# Patient Record
Sex: Female | Born: 1980 | Race: White | Hispanic: No | Marital: Single | Smoking: Never smoker
Health system: Southern US, Community
[De-identification: ages and names within clinical notes are randomized; demographics above are authoritative.]

## PROBLEM LIST (undated history)

## (undated) DIAGNOSIS — R9389 Abnormal findings on diagnostic imaging of other specified body structures: Secondary | ICD-10-CM

## (undated) DIAGNOSIS — M542 Cervicalgia: Secondary | ICD-10-CM

## (undated) DIAGNOSIS — R29898 Other symptoms and signs involving the musculoskeletal system: Secondary | ICD-10-CM

## (undated) DIAGNOSIS — H53489 Generalized contraction of visual field, unspecified eye: Secondary | ICD-10-CM

## (undated) DIAGNOSIS — R200 Anesthesia of skin: Secondary | ICD-10-CM

## (undated) DIAGNOSIS — M5412 Radiculopathy, cervical region: Secondary | ICD-10-CM

## (undated) DIAGNOSIS — G43909 Migraine, unspecified, not intractable, without status migrainosus: Secondary | ICD-10-CM

## (undated) DIAGNOSIS — H538 Other visual disturbances: Secondary | ICD-10-CM

## (undated) HISTORY — DX: Abnormal findings on diagnostic imaging of other specified body structures: R93.89

## (undated) HISTORY — DX: Other visual disturbances: H53.8

## (undated) HISTORY — DX: Cervicalgia: M54.2

## (undated) HISTORY — DX: Anesthesia of skin: R20.0

## (undated) HISTORY — DX: Radiculopathy, cervical region: M54.12

## (undated) HISTORY — DX: Migraine, unspecified, not intractable, without status migrainosus: G43.909

## (undated) HISTORY — DX: Other symptoms and signs involving the musculoskeletal system: R29.898

## (undated) HISTORY — DX: Generalized contraction of visual field, unspecified eye: H53.489

---
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
## 1999-02-02 ENCOUNTER — Inpatient Hospital Stay (HOSPITAL_COMMUNITY): Admission: AD | Admit: 1999-02-02 | Discharge: 1999-02-02 | Payer: Self-pay | Admitting: *Deleted

## 1999-02-02 ENCOUNTER — Inpatient Hospital Stay (HOSPITAL_COMMUNITY): Admission: AD | Admit: 1999-02-02 | Discharge: 1999-02-02 | Payer: Self-pay | Admitting: Obstetrics and Gynecology

## 1999-02-10 ENCOUNTER — Inpatient Hospital Stay (HOSPITAL_COMMUNITY): Admission: AD | Admit: 1999-02-10 | Discharge: 1999-02-10 | Payer: Self-pay | Admitting: *Deleted

## 1999-03-05 ENCOUNTER — Inpatient Hospital Stay (HOSPITAL_COMMUNITY): Admission: AD | Admit: 1999-03-05 | Discharge: 1999-03-08 | Payer: Self-pay | Admitting: *Deleted

## 1999-03-31 ENCOUNTER — Inpatient Hospital Stay (HOSPITAL_COMMUNITY): Admission: AD | Admit: 1999-03-31 | Discharge: 1999-03-31 | Payer: Self-pay | Admitting: Obstetrics and Gynecology

## 1999-12-24 ENCOUNTER — Encounter: Admission: RE | Admit: 1999-12-24 | Discharge: 1999-12-24 | Payer: Self-pay | Admitting: Internal Medicine

## 1999-12-24 ENCOUNTER — Encounter: Payer: Self-pay | Admitting: Internal Medicine

## 2000-05-13 ENCOUNTER — Other Ambulatory Visit: Admission: RE | Admit: 2000-05-13 | Discharge: 2000-05-13 | Payer: Self-pay | Admitting: Plastic Surgery

## 2001-05-12 ENCOUNTER — Other Ambulatory Visit: Admission: RE | Admit: 2001-05-12 | Discharge: 2001-05-12 | Payer: Self-pay | Admitting: Obstetrics and Gynecology

## 2001-10-18 ENCOUNTER — Encounter: Admission: RE | Admit: 2001-10-18 | Discharge: 2001-11-01 | Payer: Self-pay | Admitting: Obstetrics and Gynecology

## 2001-11-28 ENCOUNTER — Inpatient Hospital Stay (HOSPITAL_COMMUNITY): Admission: AD | Admit: 2001-11-28 | Discharge: 2001-12-01 | Payer: Self-pay | Admitting: *Deleted

## 2001-12-02 ENCOUNTER — Encounter: Admission: RE | Admit: 2001-12-02 | Discharge: 2002-01-01 | Payer: Self-pay | Admitting: Obstetrics and Gynecology

## 2001-12-08 ENCOUNTER — Inpatient Hospital Stay (HOSPITAL_COMMUNITY): Admission: AD | Admit: 2001-12-08 | Discharge: 2001-12-08 | Payer: Self-pay | Admitting: Obstetrics and Gynecology

## 2001-12-10 ENCOUNTER — Inpatient Hospital Stay (HOSPITAL_COMMUNITY): Admission: AD | Admit: 2001-12-10 | Discharge: 2001-12-10 | Payer: Self-pay | Admitting: Obstetrics and Gynecology

## 2002-12-20 ENCOUNTER — Other Ambulatory Visit: Admission: RE | Admit: 2002-12-20 | Discharge: 2002-12-20 | Payer: Self-pay | Admitting: Obstetrics and Gynecology

## 2003-03-08 ENCOUNTER — Encounter: Payer: Self-pay | Admitting: *Deleted

## 2003-03-08 ENCOUNTER — Emergency Department (HOSPITAL_COMMUNITY): Admission: EM | Admit: 2003-03-08 | Discharge: 2003-03-08 | Payer: Self-pay | Admitting: Emergency Medicine

## 2003-12-04 ENCOUNTER — Inpatient Hospital Stay (HOSPITAL_COMMUNITY): Admission: AD | Admit: 2003-12-04 | Discharge: 2003-12-04 | Payer: Self-pay | Admitting: Obstetrics and Gynecology

## 2003-12-25 ENCOUNTER — Encounter: Admission: RE | Admit: 2003-12-25 | Discharge: 2003-12-25 | Payer: Self-pay | Admitting: Obstetrics and Gynecology

## 2004-01-09 ENCOUNTER — Inpatient Hospital Stay (HOSPITAL_COMMUNITY): Admission: AD | Admit: 2004-01-09 | Discharge: 2004-01-09 | Payer: Self-pay | Admitting: Obstetrics and Gynecology

## 2004-01-10 ENCOUNTER — Inpatient Hospital Stay (HOSPITAL_COMMUNITY): Admission: AD | Admit: 2004-01-10 | Discharge: 2004-01-10 | Payer: Self-pay | Admitting: Obstetrics and Gynecology

## 2004-01-23 ENCOUNTER — Ambulatory Visit (HOSPITAL_COMMUNITY): Admission: RE | Admit: 2004-01-23 | Discharge: 2004-01-23 | Payer: Self-pay | Admitting: Obstetrics and Gynecology

## 2004-01-24 ENCOUNTER — Inpatient Hospital Stay (HOSPITAL_COMMUNITY): Admission: AD | Admit: 2004-01-24 | Discharge: 2004-01-26 | Payer: Self-pay | Admitting: Obstetrics and Gynecology

## 2004-03-11 ENCOUNTER — Other Ambulatory Visit: Admission: RE | Admit: 2004-03-11 | Discharge: 2004-03-11 | Payer: Self-pay | Admitting: Obstetrics and Gynecology

## 2004-11-22 IMAGING — US US OB FOLLOW-UP
1 series · 13 of 28 positions shown · non-contrast
Comparison: none

CLINICAL DATA: 38 week 5 day assigned gestational age.  Gestational diabetes.  Measuring large for dates.

[Series 1: unknown · 0.33mm/px · 13 of 30 slices shown]
[im 2/30]
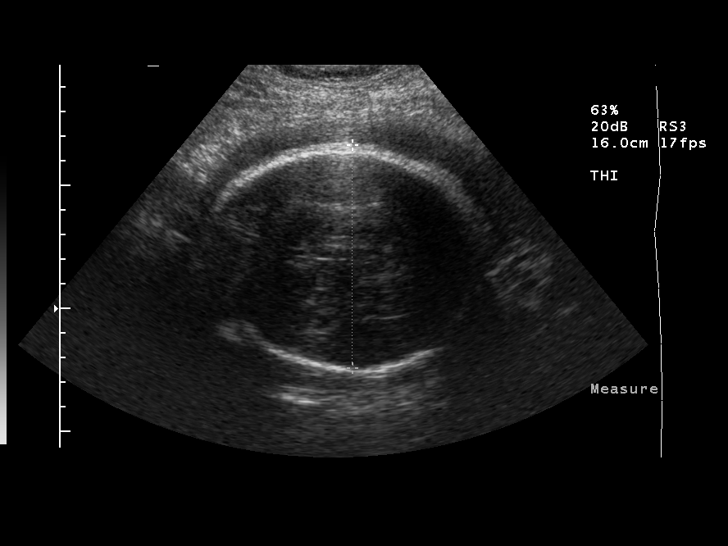
[im 4/30]
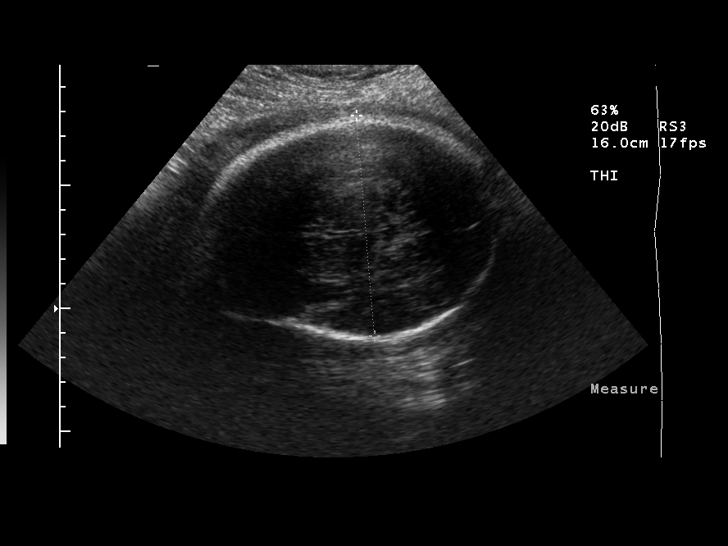
[im 6/30]
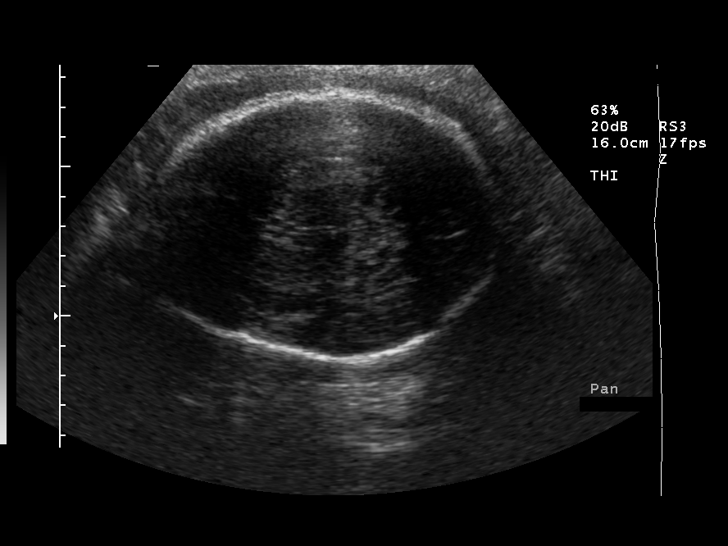
[im 8/30]
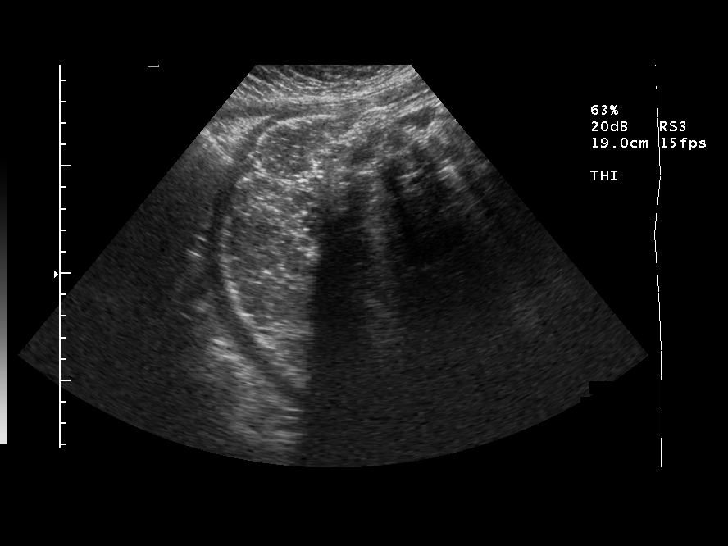
[im 10/30]
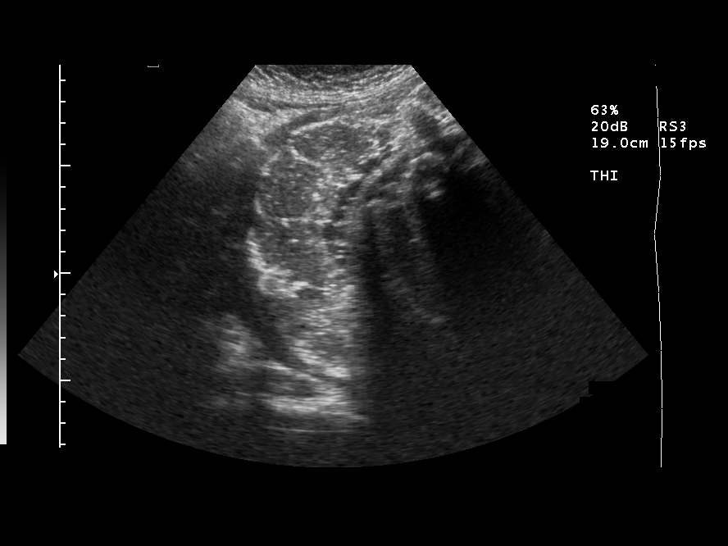
[im 12/30]
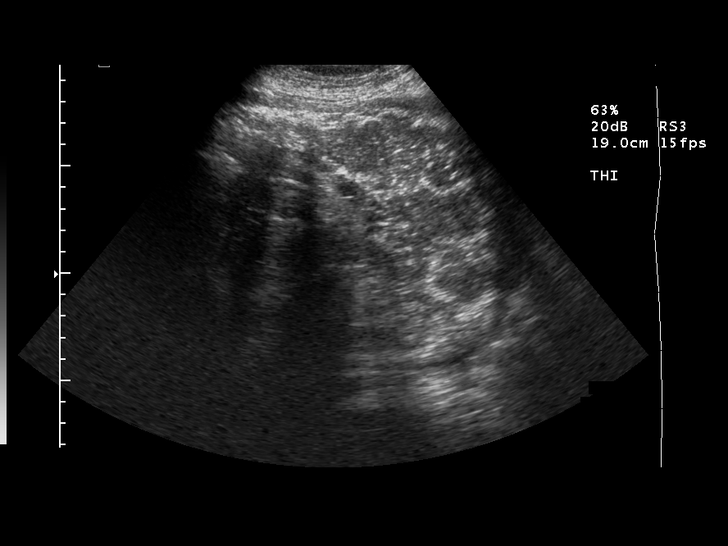
[im 16/30]
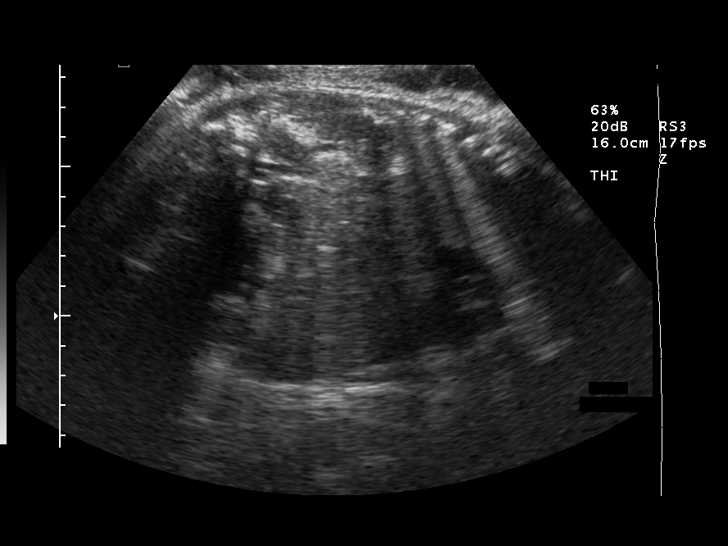
[im 18/30]
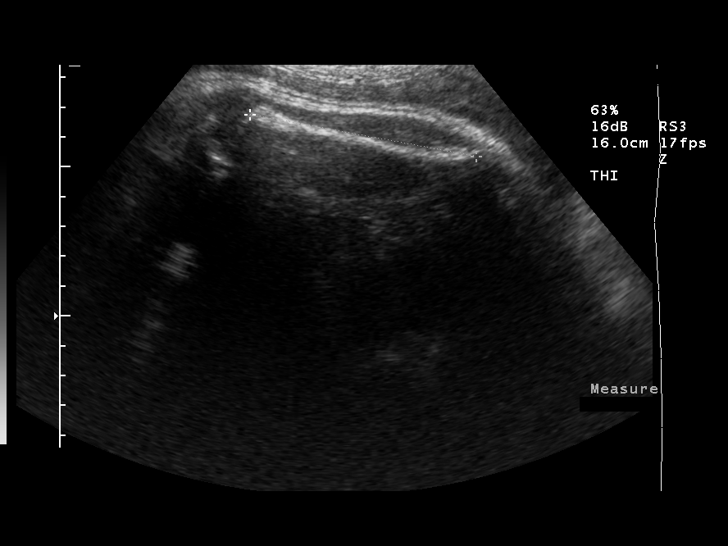
[im 20/30]
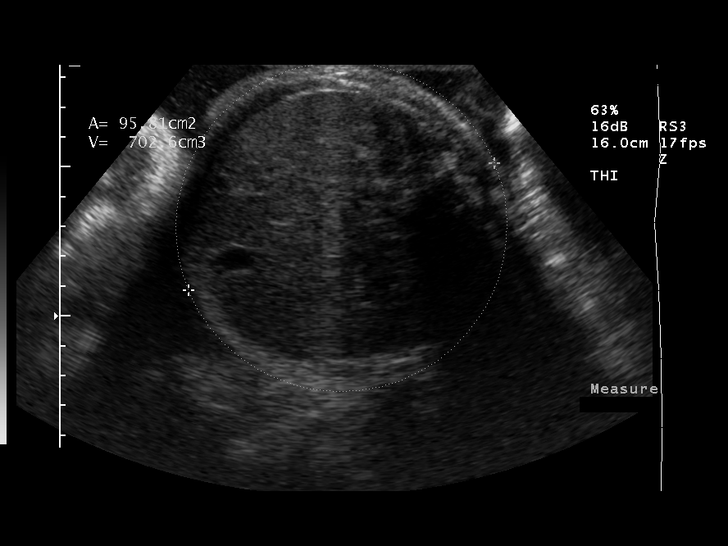
[im 22/30]
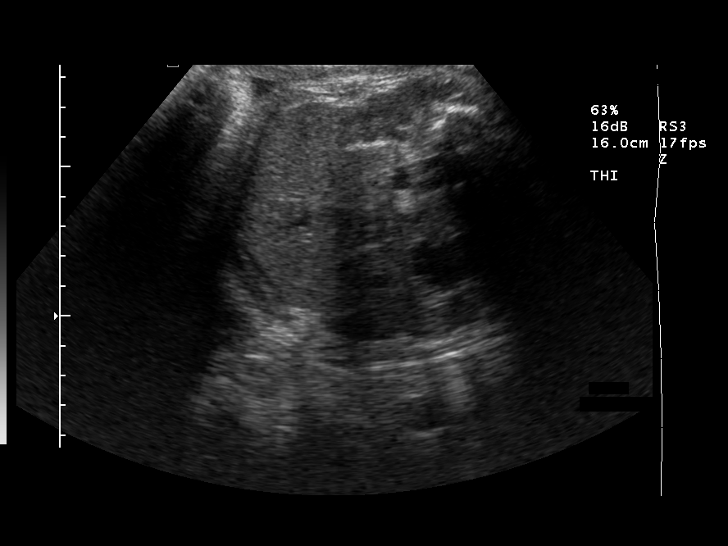
[im 24/30]
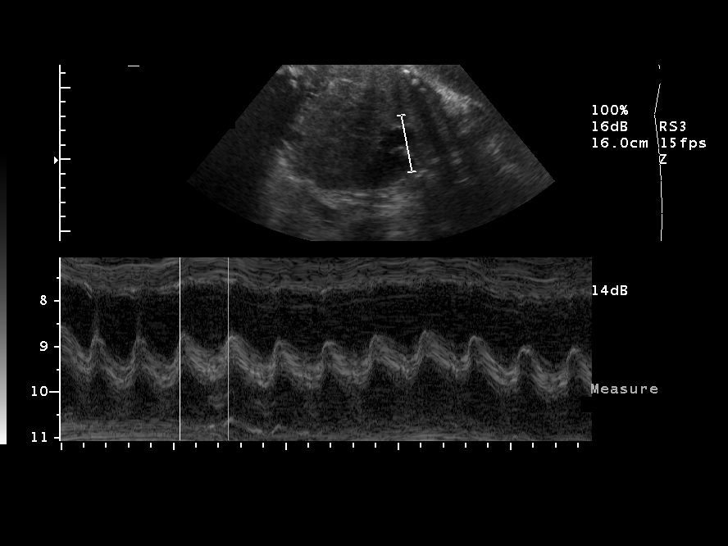
[im 26/30]
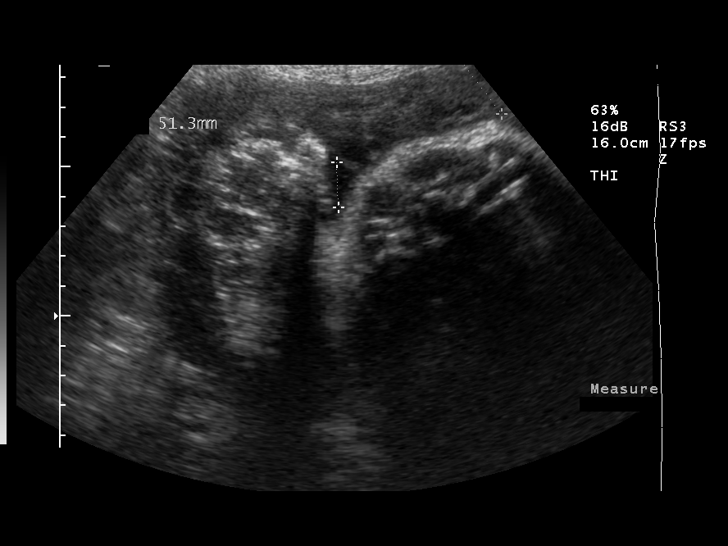
[im 28/30]
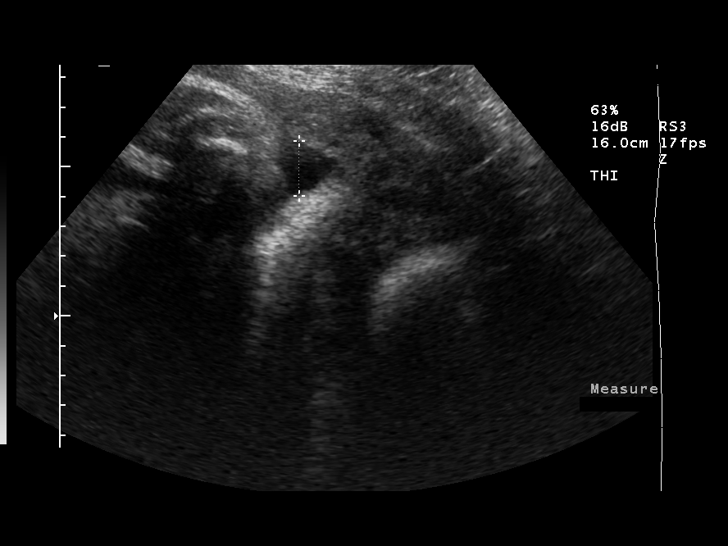

[13 of 28 positions shown; findings below may reference images not displayed]

OBSTETRICAL ULTRASOUND RE-EVALUATION
 Number of Fetuses: 1
 Heart Rate:  140
 Movement:  Yes
 Breathing:  No
 Presentation:  Cephalic
 Placental Location:  Fundal, anterior
 Grade:  II
 Previa:  No
 Amniotic Fluid (subjective):  Normal
 Amniotic Fluid (objective):  9.4 cm AFI (5th -95th%ile = 7.2 – 22.6 cm for 39 wks)

 FETAL BIOMETRY
 BPD:  9.0 cm   36 w 2 d
 HC:  33.6 cm   38 w 3 d
 AC:  34.5 cm   38 w 2 d
 FL:   7.6 cm   38 w 6 d

 Mean GA:  38 w 0 d
 Assigned GA:  38 w 5 d

 EFW: 7589 g (H) 50th – 75th%ile (8118 – 8044 g) For 39 wks 

 FETAL ANATOMY
 Lateral Ventricles:  Visualized 
 Thalami/CSP:  Visualized 
 Posterior Fossa:  Previously seen 
 Nuchal Region:  N/A
 Spine:  Not visualized 
 4 Chamber Heart on Left:  Previously seen 
 Stomach on Left:  Visualized 
 3 Vessel Cord:  Not visualized 
 Cord Insertion Site:  Not visualized 
 Kidneys:  Visualized 
 Bladder:  Visualized 
 Extremities:  Not visualized 
 Limited by:  Advanced gestational age 

 MATERNAL FINDINGS
 Cervix:  Not evaluated
IMPRESSION: Assigned gestational age is currently 38 weeks 5 days.  Appropriate fetal growth, with EFW at the 50th – 75th percentile.
 Normal amniotic fluid volume.

## 2006-10-04 ENCOUNTER — Ambulatory Visit: Payer: Self-pay | Admitting: Family Medicine

## 2018-01-25 ENCOUNTER — Other Ambulatory Visit: Payer: Self-pay | Admitting: Internal Medicine

## 2018-01-25 DIAGNOSIS — R1031 Right lower quadrant pain: Principal | ICD-10-CM

## 2018-01-25 DIAGNOSIS — G8929 Other chronic pain: Secondary | ICD-10-CM

## 2018-02-02 ENCOUNTER — Ambulatory Visit
Admission: RE | Admit: 2018-02-02 | Discharge: 2018-02-02 | Disposition: A | Discharge status: Home / Self Care | Payer: No Typology Code available for payment source | Source: Ambulatory Visit | Attending: Internal Medicine | Admitting: Internal Medicine

## 2018-02-02 DIAGNOSIS — R1031 Right lower quadrant pain: Principal | ICD-10-CM

## 2018-02-02 DIAGNOSIS — G8929 Other chronic pain: Secondary | ICD-10-CM

## 2018-07-30 IMAGING — US US PELVIS COMPLETE TRANSABD/TRANSVAG
1 series · 13 of 25 positions shown · non-contrast
Comparison: None

CLINICAL DATA: Initial evaluation for chronic right lower quadrant
pain.



[Series 1: us pelvis complete transabd/transvag · 0.30mm/px · 13 of 97 slices shown]
[im 1/97]
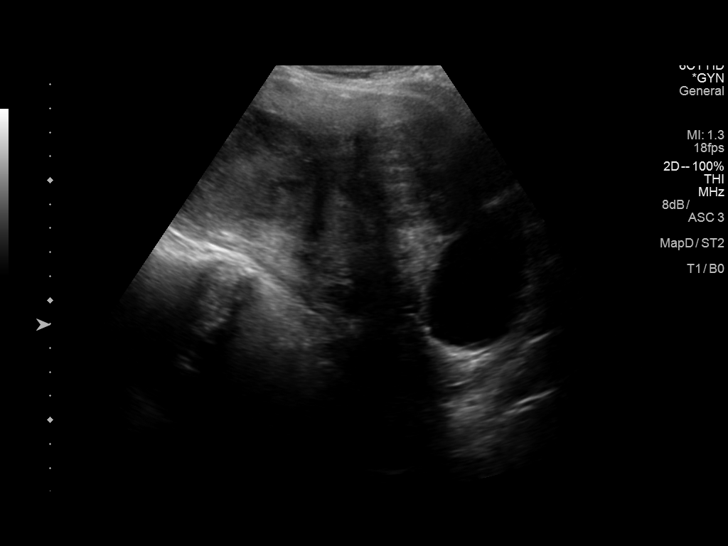
[im 9/97]
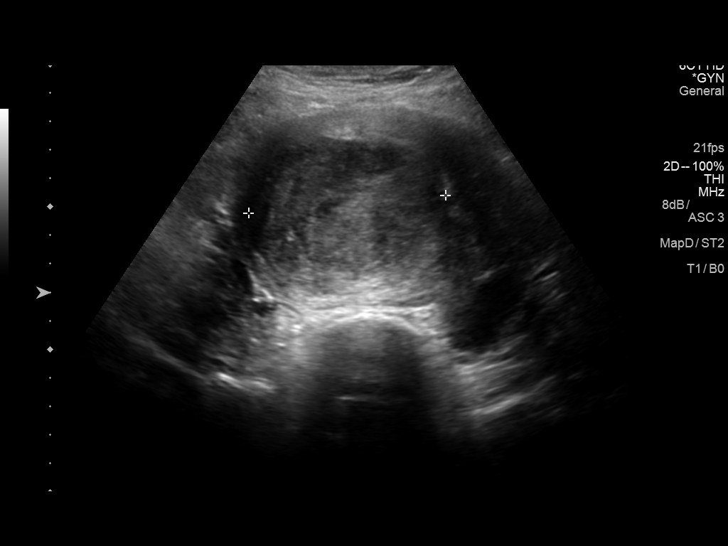
[im 17/97]
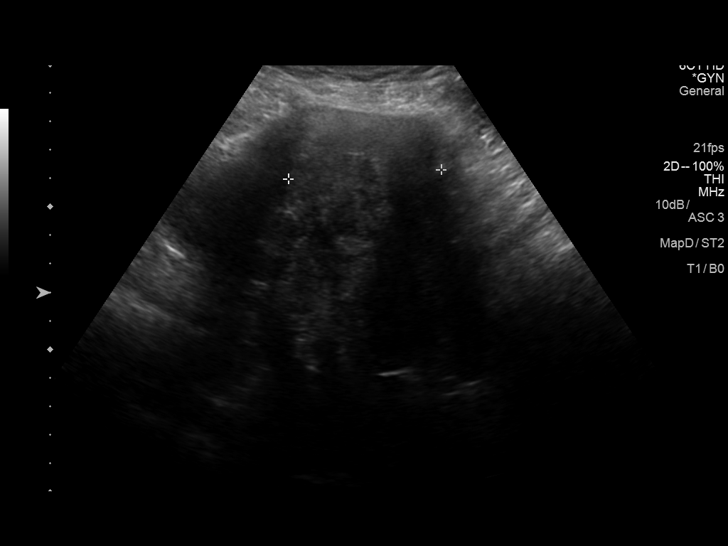
[im 25/97]
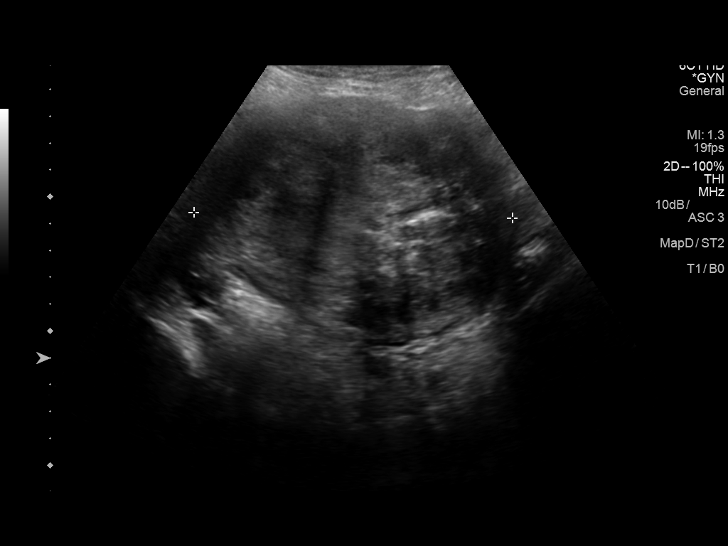
[im 33/97]
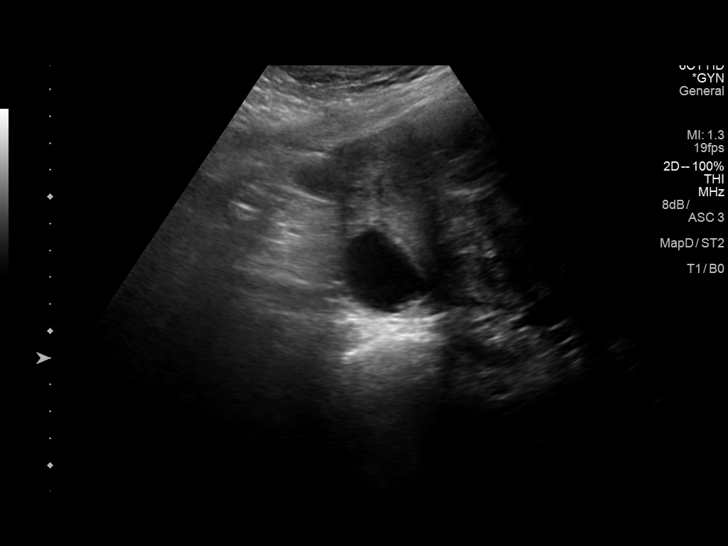
[im 41/97]
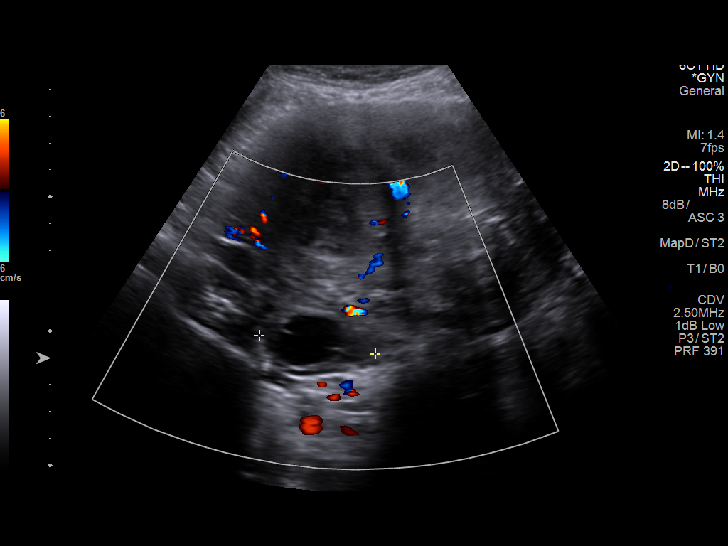
[im 49/97]
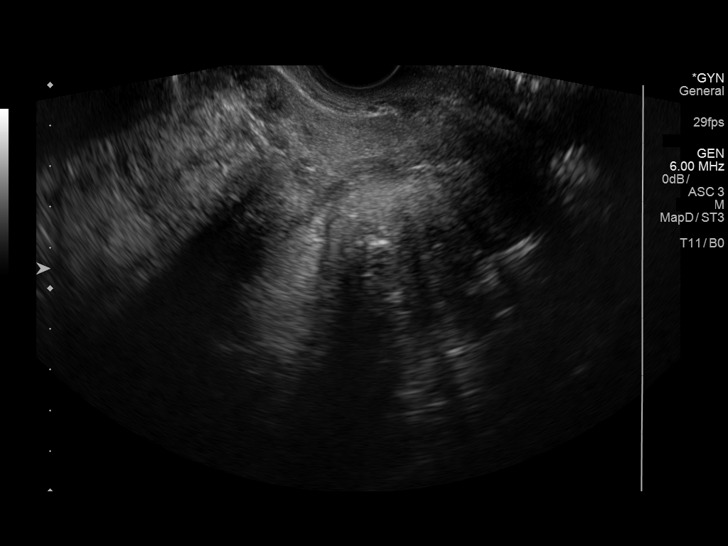
[im 57/97]
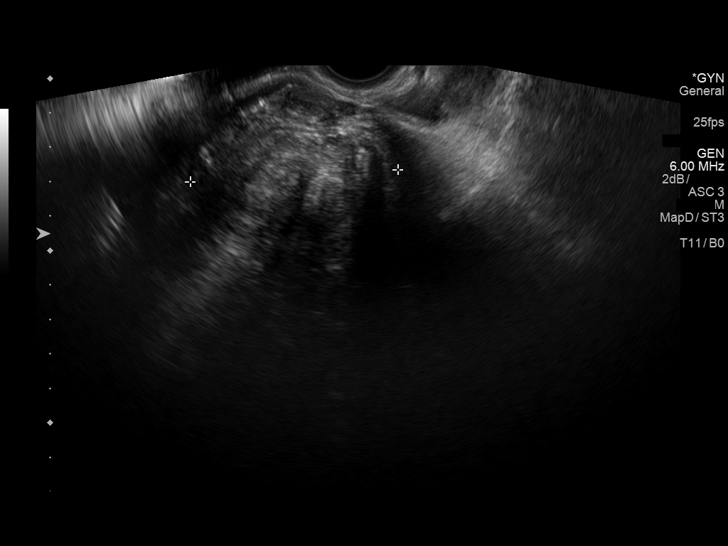
[im 65/97]
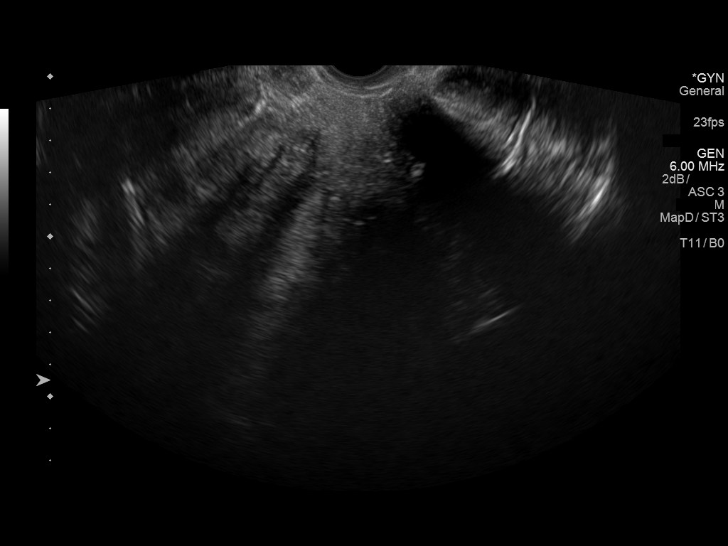
[im 73/97]
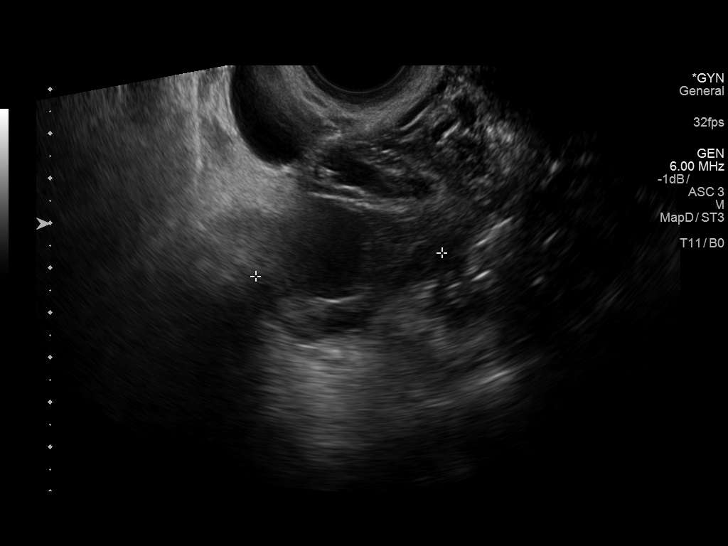
[im 81/97]
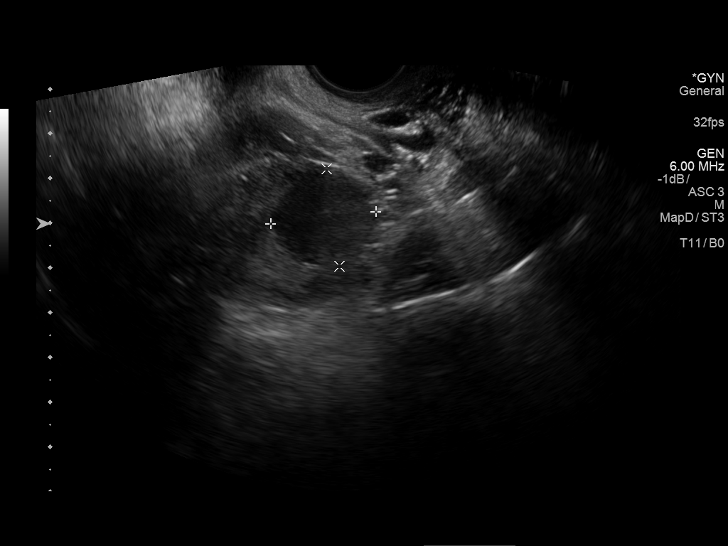
[im 89/97]
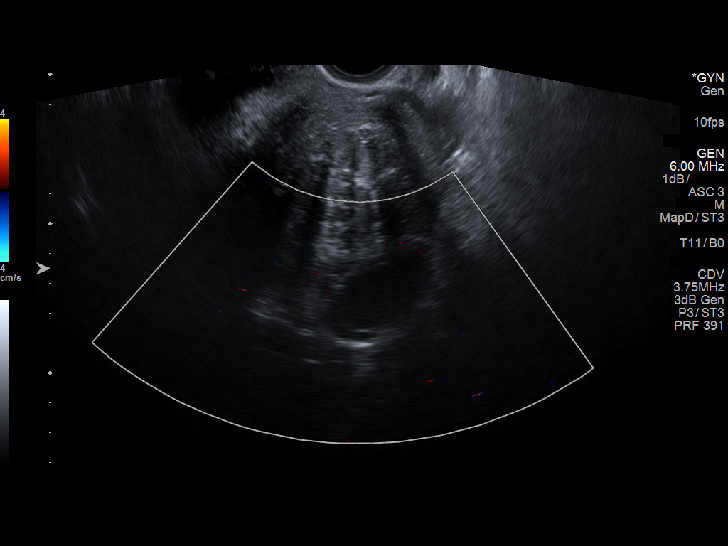
[im 97/97]
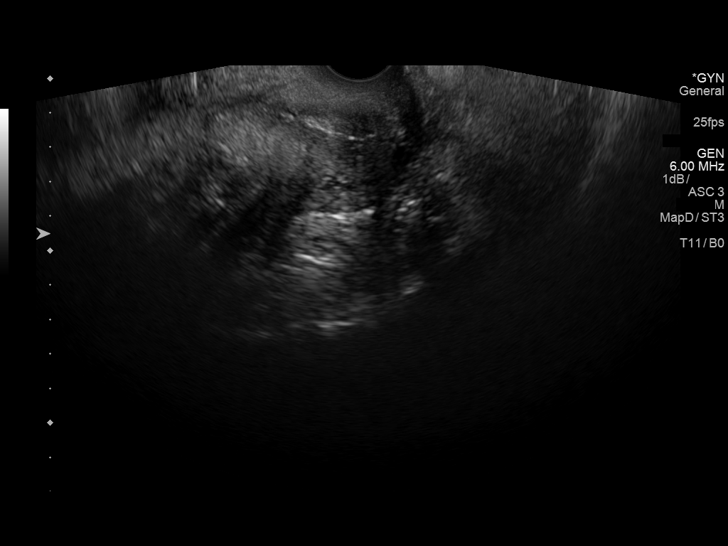

[13 of 25 positions shown; findings below may reference images not displayed]

FINDINGS: Uterus

Measurements: 18.0 x 9.8 x 11.9 cm. Three large uterine fibroids are
seen as follows; 6.9 x 6.0 x 6.9 cm fibroid at the central uterine
fundus, 5.5 x 5.5 x 5.4 cm intramural fibroid at the anterior left
uterine body, 5.9 x 4.0 x 4.9 cm intramural fibroid at posterior
right lower uterine segment.

Endometrium

Thickness: 13.5 mm.  No focal abnormality visualized.

Right ovary

Measurements: 3.7 x 3.4 x 4.2 cm. 2.4 x 2.2 x 2.2 cm cyst, most
likely a normal physiologic cyst/dominant follicle. No other adnexal
mass.

Left ovary

Measurements: 5.0 x 2.6 x 3.3 cm. 3.4 x 2.5 x 3.9 cm simple cyst,
most likely a physiologic cyst. No other adnexal mass.

Other findings

No abnormal free fluid.
IMPRESSION: 1. Enlarged fibroid uterus as above.
2. Bilateral ovarian cysts as above, most consistent with normal
physiologic cysts.
3. No other acute abnormality within the pelvis.

## 2022-04-14 DIAGNOSIS — M542 Cervicalgia: Secondary | ICD-10-CM | POA: Insufficient documentation

## 2022-05-05 ENCOUNTER — Telehealth: Payer: Self-pay | Admitting: Ophthalmology

## 2022-05-05 NOTE — Telephone Encounter (Signed)
error 

## 2022-05-06 ENCOUNTER — Encounter: Payer: Self-pay | Admitting: Neurology

## 2022-05-06 ENCOUNTER — Ambulatory Visit: Payer: Self-pay | Admitting: Neurology

## 2022-05-06 VITALS — BP 138/80 | HR 70 | Ht 69.0 in | Wt 155.0 lb

## 2022-05-06 DIAGNOSIS — R202 Paresthesia of skin: Secondary | ICD-10-CM | POA: Insufficient documentation

## 2022-05-06 DIAGNOSIS — R9389 Abnormal findings on diagnostic imaging of other specified body structures: Secondary | ICD-10-CM

## 2022-05-06 DIAGNOSIS — I6310 Cerebral infarction due to embolism of unspecified precerebral artery: Secondary | ICD-10-CM

## 2022-05-06 NOTE — Progress Notes (Signed)
Chief Complaint  Patient presents with   New Patient (Initial Visit)    Rm 13. Accompanied by daughter. NP/[facility]/Robert [facility] MD/blurry vision,episodic arm numbness, concern for IIH vs MS.      ASSESSMENT AND PLAN  Shannon Murphy is a 41 y.o. female   MRI of the brain showed possible embolic stroke, involving left occipital lobe, right frontal lobe,  Patient is self-pay,  I have suggested repeat MRI of the brain with without contrast, extensive laboratory evaluation, but because of the financial concerns, will only complete limited laboratory evaluation today,  I also ordered ultrasound of carotid artery, echocardiogram to complete evaluations, if above is normal, may consider cardiac monitoring  Start aspirin 81 mg daily   DIAGNOSTIC DATA (LABS, IMAGING, TESTING) - I reviewed patient records, labs, notes, testing and imaging myself where available. I personally reviewed MRI of the brain with without contrast from [facility] health February 27, 2022, multiple area of abnormal T2/FLAIR hyperintensity signal, in bilateral cerebral periventricular and subcortical white matter,  Moderate size of abnormal T2/FLAIR hyperintensity signal in the cortex and subcortical white matter of posterior lateral left occipital lobe, showed gyriform and mild T1 hyperintensity, compatible with cortical laminar necrosis, mild diffusion hyperintensity at the side with mixed elevated and some low ADC consistent with developing encephalomalacia there is a small area of heterogeneous abnormal signal in the cortex and subcortical white matter of the inferolateral right frontal lobe and anterior, superior insula and subinsular region, also involving the right frontal operculum, along the anterior, superior margin of the right sylvian fissure, this shows internal T1 hypointensity and FLAIR hyperintensity consistent with small area of macrocystic encephalomalacia with surrounding gliosis,  Following IV contrast  administration, multiple scattered small areas of parenchymal enhancement in the complex both cerebral hemisphere, also enhancement of area of abnormal signal in the posterior lateral left occipital lobe, 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 Alden Hipp evaluation April 16, 2022, she visual field, OCT was normal, funduscopy examination disc margins are slightly blurred, OD more than OS, there was no spontaneous venous pulsation,   MEDICAL HISTORY:  Shannon Murphy is a 41 year old female, seen in request by ophthalmology Dr. Ernesto Rutherford, for evaluation of blurry vision, her primary care physician is Dr. Burton Apley, initial evaluation was on May 06, 2022  I reviewed and summarized the referring note. PMHX. ADHD, Adderall 10mg  3 tabs in one day. Chronic low back and shoulder pain,   She reported history of migraine headache, sometimes migraine preceded by tunnel vision, blurry vision, followed by holoacranial pounding headaches,  She works by cleaning houses, sometimes commercial building, in July 2023, while she was cleaning a house with no air conditioning, she felt hot, after working for few hours, while driving, she noticed spots in her vision, lasting for few minutes,  Few hours later, she began to notice left arm numbness, she was still able to drive, but when she got home, she felt left-sided arm weakness, drop things from her hands, denies left leg weakness, also noticed difficulty getting her words out, she denied loss of consciousness,  Left hand numbness and weakness last for 2 days, now back to baseline, still has difficulty with her vision, described missing half of the visual field while reading subtitles,  She did have a history of chronic neck pain, sometimes radiating pain to right shoulder blade,  denies gait abnormality no incontinence  I personally reviewed MRI of the brain with without contrast from [facility] February 27, 2022, multiple area of abnormal T2/FLAIR hyperintensity signal, in bilateral cerebral periventricular and subcortical white matter,  Moderate size of abnormal T2/FLAIR hyperintensity signal in the cortex and subcortical white matter of posterior lateral left occipital lobe, showed gyriform and mild T1 hyperintensity, compatible with cortical laminar necrosis, mild diffusion hyperintensity at the side with mixed elevated and some low ADC consistent with developing encephalomalacia there is a small area of heterogeneous abnormal signal in the cortex and subcortical white matter of the inferolateral right frontal lobe and anterior, superior insula and subinsular region, also involving the right frontal operculum, along the anterior, superior margin of the right sylvian fissure, this shows internal T1 hypointensity and FLAIR hyperintensity consistent with small area of macrocystic encephalomalacia with surrounding gliosis,  Following IV contrast administration, multiple scattered small areas of parenchymal enhancement in the complex both cerebral hemisphere, also enhancement of area of abnormal signal in the posterior lateral left occipital lobe, 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 Alden Hipp evaluation April 16, 2022, she visual field, OCT was normal, funduscopy examination disc margins are slightly blurred, OD more than OS, there was no spontaneous venous pulsation,   PHYSICAL EXAM:   There were no vitals filed for this visit. Not recorded     There is no height or weight on file to calculate BMI.  PHYSICAL EXAMNIATION:  Gen: NAD, conversant, well nourised, well groomed                      Cardiovascular: Regular rate rhythm, no peripheral edema, warm, nontender. Eyes: Conjunctivae clear without exudates or hemorrhage Neck: Supple, no carotid bruits. Pulmonary: Clear to auscultation bilaterally   NEUROLOGICAL EXAM:  MENTAL STATUS: Speech/cognition: Awake, alert, oriented to history taking and casual conversation CRANIAL NERVES: CN II: Visual fields are full to confrontation. Pupils are round equal and briskly reactive to light. CN III, IV, VI: extraocular movement are normal. No ptosis. CN V: Facial sensation is intact to light touch CN VII: Face is symmetric with normal eye closure  CN VIII: Hearing is normal to causal conversation. CN IX, X: Phonation is normal. CN XI: Head turning and shoulder shrug are intact  MOTOR: There is no pronator drift of out-stretched arms. Muscle bulk and tone are normal. Muscle strength is normal.  REFLEXES: Reflexes are 2+ and symmetric at the biceps, triceps, knees, and ankles. Plantar responses are flexor.  SENSORY: Intact to light touch, pinprick and vibratory sensation are intact in fingers and toes.  COORDINATION: There is no trunk or limb dysmetria noted.  GAIT/STANCE: Posture is normal. Gait is steady with normal steps, base, arm swing, and turning. Heel and toe walking are normal. Tandem gait is normal.  Romberg is absent.  REVIEW OF SYSTEMS:  Full 14 system review of systems performed and notable only for as above All other review of systems were negative.   ALLERGIES: No Known Allergies  HOME MEDICATIONS: Current Outpatient Medications  Medication Sig Dispense Refill   amphetamine-dextroamphetamine (ADDERALL) 10 MG tablet Take 1 tablet by mouth daily.     HYDROcodone-acetaminophen (NORCO/VICODIN) 5-325 MG tablet Take 1 tablet by mouth daily.     No current facility-administered medications for this visit.    PAST MEDICAL HISTORY: No past medical history  on file.  PAST SURGICAL HISTORY: None  FAMILY  HISTORY: No family history on file.  SOCIAL HISTORY: Social History   Socioeconomic History   Marital status: Single    Spouse name: Not on file   Number of children: Not on file   Years of education: Not on file   Highest education level: Not on file  Occupational History   Not on file  Tobacco Use   Smoking status: Not on file   Smokeless tobacco: Not on file  Substance and Sexual Activity   Alcohol use: Not on file   Drug use: Not on file   Sexual activity: Not on file  Other Topics Concern   Not on file  Social History Narrative   Not on file   Social Determinants of Health   Financial Resource Strain: Not on file  Food Insecurity: Not on file  Transportation Needs: Not on file  Physical Activity: Not on file  Stress: Not on file  Social Connections: Not on file  Intimate Partner Violence: Not on file      Marcial Pacas, M.D. Ph.D.  [facility] [address], [place], [place] [address] Ph: [phone] Fax: [phone]  CC:  Clent Jacks, MD [address] [place],  [place] [address]  Lorene Dy, MD

## 2022-05-07 ENCOUNTER — Telehealth: Payer: Self-pay | Admitting: Neurology

## 2022-05-07 DIAGNOSIS — I639 Cerebral infarction, unspecified: Secondary | ICD-10-CM

## 2022-05-07 LAB — COMPREHENSIVE METABOLIC PANEL
ALT: 16 IU/L (ref 0–32)
AST: 19 IU/L (ref 0–40)
Albumin/Globulin Ratio: 1.9 (ref 1.2–2.2)
Albumin: 4.3 g/dL (ref 3.9–4.9)
Alkaline Phosphatase: 53 IU/L (ref 44–121)
BUN/Creatinine Ratio: 12 (ref 9–23)
BUN: 10 mg/dL (ref 6–24)
Bilirubin Total: <0.2 mg/dL (ref 0.0–1.2)
CO2: 24 mmol/L (ref 20–29)
Calcium: 9 mg/dL (ref 8.7–10.2)
Chloride: 103 mmol/L (ref 96–106)
Creatinine, Ser: 0.82 mg/dL (ref 0.57–1.00)
Globulin, Total: 2.3 g/dL (ref 1.5–4.5)
Glucose: 99 mg/dL (ref 70–99)
Potassium: 4.7 mmol/L (ref 3.5–5.2)
Sodium: 138 mmol/L (ref 134–144)
Total Protein: 6.6 g/dL (ref 6.0–8.5)
eGFR: 92 mL/min/{1.73_m2} (ref >59)

## 2022-05-07 LAB — CBC WITH DIFFERENTIAL/PLATELET
Basophils Absolute: 0.1 10*3/uL (ref 0.0–0.2)
Basos: 1 %
EOS (ABSOLUTE): 0.4 10*3/uL (ref 0.0–0.4)
Eos: 10 %
Hematocrit: 33.4 % — ABNORMAL LOW (ref 34.0–46.6)
Hemoglobin: 10.4 g/dL — ABNORMAL LOW (ref 11.1–15.9)
Immature Grans (Abs): 0 10*3/uL (ref 0.0–0.1)
Immature Granulocytes: 0 %
Lymphocytes Absolute: 1 10*3/uL (ref 0.7–3.1)
Lymphs: 23 %
MCH: 25.2 pg — ABNORMAL LOW (ref 26.6–33.0)
MCHC: 31.1 g/dL — ABNORMAL LOW (ref 31.5–35.7)
MCV: 81 fL (ref 79–97)
Monocytes Absolute: 0.4 10*3/uL (ref 0.1–0.9)
Monocytes: 11 %
Neutrophils Absolute: 2.3 10*3/uL (ref 1.4–7.0)
Neutrophils: 55 %
Platelets: 282 10*3/uL (ref 150–450)
RBC: 4.12 x10E6/uL (ref 3.77–5.28)
RDW: 15 % (ref 11.7–15.4)
WBC: 4.2 10*3/uL (ref 3.4–10.8)

## 2022-05-07 LAB — C-REACTIVE PROTEIN: CRP: <1 mg/L (ref 0–10)

## 2022-05-07 LAB — ANA W/REFLEX IF POSITIVE: Anti Nuclear Antibody (ANA): NEGATIVE

## 2022-05-07 LAB — SEDIMENTATION RATE: Sed Rate: 5 mm/hr (ref 0–32)

## 2022-05-07 LAB — TSH: TSH: 3.25 u[IU]/mL (ref 0.450–4.500)

## 2022-05-07 NOTE — Telephone Encounter (Signed)
MRI given to Dr. Krista Blue for review.

## 2022-05-07 NOTE — Telephone Encounter (Signed)
Mallbox full

## 2022-05-07 NOTE — Telephone Encounter (Signed)
Discussed with patient personally reviewed brain MRI, most worrisome for embolic event, with different stage,  Hold off MRI of cervical spine, she is self-pay, Aspirin 81 mg daily Refer to cardiologist for echocardiogram, ultrasound of carotid artery, if above is normal, may consider cardiac monitoring,

## 2022-05-07 NOTE — Telephone Encounter (Signed)
I spoke with the patient and informed her of the results. She stated she has a history of anemia and will began iron supplement. I advised her to follow up with PCP. She verbalized understanding of the findings and expressed appreciation for the call.  The patient stated she has dropped off the MRI to our office.  She saw Dr. Katy Fitch today at [facility]. He recommended for the patient to follow up with Cardiology.

## 2022-05-07 NOTE — Telephone Encounter (Signed)
Please call patient, laboratory evaluation showed mild anemia hemoglobin of 10.4, evidence of small cell, suggestive of iron deficiency anemia,  She would benefit over-the-counter iron supplement  Rest of the laboratory evaluation showed no significant abnormalities,  Please advise patient to drop the MRI disc for me to review

## 2022-05-07 NOTE — Telephone Encounter (Signed)
self-pay sent to GI  

## 2022-05-14 ENCOUNTER — Telehealth: Payer: Self-pay | Admitting: Licensed Clinical Social Worker

## 2022-05-14 NOTE — Telephone Encounter (Signed)
H&V Care Navigation CSW Progress Note  Clinical Social Worker  contacted by Sonia Baller, Therapist, sports,  to request assistance for pt. Currently noted as self pay, no previous appts with cardiology but recommended for visit and testing by neurology team. Once she has an appt with cardiology and attends then we can connect with her to assist with [facility], [employer], etc if eligible.   Requested RN let us know when appt scheduled.   Patient is participating in a Managed Medicaid Plan:  No, self pay only.   SDOH Screenings   Tobacco Use: Low Risk  (05/06/2022)   [name], MSW, [place]  [phone]- work cell phone (preferred) [phone]- desk phone

## 2022-05-15 ENCOUNTER — Encounter: Payer: Self-pay | Admitting: Neurology

## 2022-05-17 DIAGNOSIS — I6310 Cerebral infarction due to embolism of unspecified precerebral artery: Secondary | ICD-10-CM | POA: Insufficient documentation

## 2022-05-18 ENCOUNTER — Telehealth: Payer: Self-pay | Admitting: Licensed Clinical Social Worker

## 2022-05-18 NOTE — Telephone Encounter (Signed)
H&V Care Navigation CSW Progress Note  Clinical Social Worker contacted patient by phone to f/u before appt w/ [facility] on 10/16. No answer at [phone], left voicemail. Will reattempt as able.   Patient is participating in a Managed Medicaid Plan:  No, self pay only.   SDOH Screenings   Tobacco Use: Low Risk  (05/06/2022)   [name], MSW, [place]  [phone]- work cell phone (preferred) [phone]- desk phone

## 2022-05-19 ENCOUNTER — Ambulatory Visit (HOSPITAL_COMMUNITY): Payer: Self-pay | Attending: Neurology

## 2022-05-19 ENCOUNTER — Telehealth: Payer: Self-pay | Admitting: Licensed Clinical Social Worker

## 2022-05-19 DIAGNOSIS — I6310 Cerebral infarction due to embolism of unspecified precerebral artery: Secondary | ICD-10-CM | POA: Insufficient documentation

## 2022-05-19 DIAGNOSIS — R202 Paresthesia of skin: Secondary | ICD-10-CM | POA: Insufficient documentation

## 2022-05-19 DIAGNOSIS — R9389 Abnormal findings on diagnostic imaging of other specified body structures: Secondary | ICD-10-CM | POA: Insufficient documentation

## 2022-05-19 DIAGNOSIS — I6389 Other cerebral infarction: Secondary | ICD-10-CM

## 2022-05-19 LAB — ECHOCARDIOGRAM COMPLETE
Area-P 1/2: 2.24 cm2
S' Lateral: 2.7 cm

## 2022-05-19 NOTE — Telephone Encounter (Signed)
H&V Care Navigation CSW Progress Note  Clinical Social Worker  had contacted pt by phone on 10/2  to touch base with pt before Heartcare appt on 10/16. No call back yet at this time. I have mailed [employer] application to pt along with my card and a note to get in contact. I will re-attempt pt again if I do not get a call this week.   Patient is participating in a Managed Medicaid Plan:  No, self pay only.   SDOH Screenings   Tobacco Use: Low Risk  (05/06/2022)    [name], MSW, [place]  [phone]- work cell phone (preferred) [phone]- desk phone

## 2022-05-26 ENCOUNTER — Telehealth: Payer: Self-pay | Admitting: Licensed Clinical Social Worker

## 2022-05-26 NOTE — Telephone Encounter (Signed)
H&V Care Navigation CSW Progress Note  Clinical Social Worker contacted patient again by phone to f/u before appt w/ [facility] on 10/16. No answer at [phone], left 2nd voicemail. Will reattempt as able.    Patient is participating in a Managed Medicaid Plan:  No, self pay only.     SDOH Screenings   Tobacco Use: Low Risk  (05/21/2022)   [name], MSW, [place]  [phone]- work cell phone (preferred) [phone]- desk phone

## 2022-05-27 ENCOUNTER — Other Ambulatory Visit: Payer: Self-pay | Admitting: Neurology

## 2022-05-28 ENCOUNTER — Telehealth: Payer: Self-pay | Admitting: Licensed Clinical Social Worker

## 2022-05-28 NOTE — Telephone Encounter (Signed)
H&V Care Navigation CSW Progress Note  Clinical Social Worker contacted patient by phone to f/u before appt w/ [facility] on 10/16. No answer at [phone], left 3rd voicemail. LCSW has mailed pt an application for [employer]  but received no call/contact back from pt at this time despite 3 voicemails as well. Remain available as needed.    Patient is participating in a Managed Medicaid Plan:  No, self pay only.    SDOH Screenings   Tobacco Use: Low Risk  (05/21/2022)   [name], MSW, [place]  [phone]- work cell phone (preferred) [phone]- desk phone

## 2022-05-31 NOTE — Progress Notes (Unsigned)
Cardiology Office Note:    Date:  06/01/2022   ID:  Shannon Murphy, DOB 08-01-1981, MRN [id number]  PCP:  Burton Apley, MD   [facility] Providers Cardiologist:  None Electrophysiologist:  Maurice Small, MD     Referring MD: Levert Feinstein, MD   Chief complaint: left arm weakness and tingling  History of Present Illness:    Shannon Murphy is a 41 y.o. female with a hx of stroke referred to discuss ILR placement.  She presented to her ophthalmologist for evaluation of blurry vision.  She eventually underwent an MRI of the brain that showed possible embolic stroke involving the left occipital lobe and right frontal lobe. Echocardiogram and vascular ultrasound did not show obvious etiology, so the patient was referred for consideration of an implantable loop recorder.  Today, she reports that she is feeling fine, and she has no complaints.  She notes that her symptoms at the time of her stroke were left arm numbness and tingling.  She notes that she has had 2 similar episodes that were very brief last week.  Otherwise, she has not had sustained palpitations, chest pain, lightheadedness, shortness of breath.   Past Medical History:  Diagnosis Date   Abnormal MRI    Blurry vision    Cervical radiculopathy    Left hand weakness    Migraine    Neck pain    Numbness of left hand    Tunnel vision     History reviewed. No pertinent surgical history.  Current Medications: Current Meds  Medication Sig   amphetamine-dextroamphetamine (ADDERALL) 10 MG tablet Take 1 tablet by mouth daily.   ASPIRIN 81 PO Take by mouth. Daily   Ferrous Sulfate (SLOW FE PO) Take by mouth. 1 daily   HYDROcodone-acetaminophen (NORCO/VICODIN) 5-325 MG tablet Take 1 tablet by mouth daily.     Allergies:   Patient has no known allergies.   Social History   Socioeconomic History   Marital status: Single    Spouse name: Not on file   Number of children: Not on file   Years of education: Not  on file   Highest education level: Not on file  Occupational History   Not on file  Tobacco Use   Smoking status: Never   Smokeless tobacco: Never  Substance and Sexual Activity   Alcohol use: Yes    Comment: monthly   Drug use: Not on file   Sexual activity: Not on file  Other Topics Concern   Not on file  Social History Narrative   Not on file   Social Determinants of Health   Financial Resource Strain: Not on file  Food Insecurity: Not on file  Transportation Needs: Not on file  Physical Activity: Not on file  Stress: Not on file  Social Connections: Not on file     Family History: The patient's family history includes Stroke in her paternal aunt.  ROS:   Please see the history of present illness.    All other systems reviewed and are negative.  EKGs/Labs/Other Studies Reviewed:     TTE 05/19/2022 1. Left ventricular ejection fraction, by estimation, is 60 to 65%. The  left ventricle has normal function. The left ventricle has no regional  wall motion abnormalities. There is mild left ventricular hypertrophy.  Left ventricular diastolic parameters  were normal.   2. Right ventricular systolic function is normal. The right ventricular  size is normal. There is normal pulmonary artery systolic pressure. The  estimated  right ventricular systolic pressure is 88.1 mmHg.   3. The mitral valve is normal in structure. Trivial mitral valve  regurgitation.   4. The aortic valve is tricuspid. Aortic valve regurgitation is not  visualized. No aortic stenosis is present.   5. The inferior vena cava is normal in size with greater than 50%  respiratory variability, suggesting right atrial pressure of 3 mmHg.   EKG:  Last EKG results: Today - NSR 75 bpm   Recent Labs: 05/06/2022: ALT 16; BUN 10; Creatinine, Ser 0.82; Hemoglobin 10.4; Platelets 282; Potassium 4.7; Sodium 138; TSH 3.250      Physical Exam:    VS:  BP 124/78   Pulse 75   Ht 5\' 9"  (1.753 m)   Wt 155 lb  12.8 oz (70.7 kg)   LMP 05/11/2022 (Approximate)   SpO2 97%   BMI 23.01 kg/m     Wt Readings from Last 3 Encounters:  06/01/22 155 lb 12.8 oz (70.7 kg)  05/06/22 155 lb (70.3 kg)     GEN: Well nourished, well developed in no acute distress CARDIAC: RRR, no murmurs, rubs, gallops RESPIRATORY:  Normal work of breathing MUSCULOSKELETAL: no edema    ASSESSMENT & PLAN:    Stroke: multiple territories, concerning for a cardioembolic source. I discussed the indication for continuous monitoring with a loop recorder.  I described the logistics of the procedure and the risks, including infection and minor bleeding.  She would like to proceed. I advised her to contact her neurologist or PCP about recurrent episodes of weakness and paresthesias.            Medication Adjustments/Labs and Tests Ordered: Current medicines are reviewed at length with the patient today.  Concerns regarding medicines are outlined above.  Orders Placed This Encounter  Procedures   EKG 12-Lead   No orders of the defined types were placed in this encounter.    Signed, Melida Quitter, MD  06/01/2022 12:13 PM    [place]

## 2022-06-01 ENCOUNTER — Encounter: Payer: Self-pay | Admitting: Cardiovascular Disease

## 2022-06-01 ENCOUNTER — Ambulatory Visit: Payer: Self-pay | Attending: Cardiovascular Disease | Admitting: Cardiovascular Disease

## 2022-06-01 ENCOUNTER — Telehealth: Payer: Self-pay | Admitting: Licensed Clinical Social Worker

## 2022-06-01 VITALS — BP 124/78 | HR 75 | Ht 69.0 in | Wt 155.8 lb

## 2022-06-01 DIAGNOSIS — I6310 Cerebral infarction due to embolism of unspecified precerebral artery: Secondary | ICD-10-CM

## 2022-06-01 NOTE — Progress Notes (Signed)
Heart and Vascular Care Navigation  06/01/2022  Shannon Murphy 1980-12-03 [id number]  Reason for Referral:  Patient is participating in a Managed Medicaid Plan:No, self pay only.   Engaged with patient by telephone for initial visit for Heart and Vascular Care Coordination.                                                                                                   Assessment:        LCSW received call back following my text. Pt shares that her phone blocks numbers that arent saved in her phone. LCSW introduced self, role, reason for call. Pt confirmed her home address, PCP, and that she is currently uninsured. She lives with her significant other, and two children (18 and early 45s). Pt shares that she recently divorced- I clarified emergency contacts and updated that list. She works as a Engineer, water. She does not receive any assistance at this time such as SNAP, denies issues obtaining or affording medications and costs of living at this time. Pt able to get to appts.   Confirmed she received the application for [employer] Assistance that I had sent to her- she and I reviewed it and how to complete application portion. We reviewed the list of needed documents and how to submit if she would like to do that herself. Also let her know I could assist if needed with making copies or submitting via mail. No additional questions/concerns at this time.                                   HRT/VAS Care Coordination     Patients Home Cardiology Office [facility]   Outpatient Care Team Social Worker   Social Worker Name: Valeda Malm, [place] [facility] [phone]   Living arrangements for the past 2 months Single Family Home   Lives with: Adult Children; Significant Other   Patient Current Insurance Coverage Self-Pay   Patient Has Concern With [place] Yes   Patient Concerns With Medical Bills ongoing medical work up and implant recommended    Medical Bill Referrals: [facility] Financial Assistance   Does Patient Have Prescription Coverage? No  denies concerns related to medication costs at this time       Social History:                                                                             [place]: No Food Insecurity (06/01/2022)  Housing: Low Risk  (06/01/2022)  Transportation Needs: No Transportation Needs (06/01/2022)  Utilities: Not At Risk (06/01/2022)  Financial Resource Strain: Low Risk  (06/01/2022)  Tobacco Use: Low Risk  (06/01/2022)    SDOH  Interventions: Financial Resources:  Financial Strain Interventions: Other (Comment) Civil engineer, contracting) Editor, commissioning for [employer] Program  Food Insecurity:  Food Insecurity Interventions: Intervention Not Indicated  Housing Insecurity:  Housing Interventions: Intervention Not Indicated  Transportation:   Transportation Interventions: Intervention Not Indicated    Other Care Navigation Interventions:     Provided Pharmacy assistance resources  Pt denies any issues with obtaining or affording medications- we discussed [facility] should pt have concerns with cost.    Follow-up plan:   LCSW will f/u with pt moving forward to address any additional questions/concerns and ensure she is able to submit application. I remain available, pt was encouraged to f/u with me before that time if needed.

## 2022-06-01 NOTE — Telephone Encounter (Signed)
H&V Care Navigation CSW Progress Note  Clinical Social Worker contacted patient by phone to f/u on assistance applications after appt this morning. No answer again despite calling twice to [phone], went straight to voicemail each time. I have sent pt a text message stating the following: "Hi, this is [place] [facility], Heart & Vascular Care Navigation. I have been trying to reach Ms. Shannon Murphy. If you have time please give me a call back- no rush. Thanks!"   LCSW remains available.   Patient is participating in a Managed Medicaid Plan:  No, self pay only.   SDOH Screenings   Tobacco Use: Low Risk  (06/01/2022)   [name], MSW, [place]  [phone]- work cell phone (preferred) [phone]- desk phone

## 2022-06-01 NOTE — Patient Instructions (Signed)
Medication Instructions:  Your physician recommends that you continue on your current medications as directed. Please refer to the Current Medication list given to you today.  *If you need a refill on your cardiac medications before your next appointment, please call your pharmacy*  Procedures: Your provider has recommended that you have an implantable loop recorder placed. There are no special instructions or restrictions prior to this appointment  Follow-Up: At [facility], you and your health needs are our priority.  As part of our continuing mission to provide you with exceptional heart care, we have created designated Provider Care Teams.  These Care Teams include your primary Cardiologist (physician) and Advanced Practice Providers (APPs -  Physician Assistants and Nurse Practitioners) who all work together to provide you with the care you need, when you need it.  Your next appointment:   Next available  The format for your next appointment:   In Person  Provider:   Doralee Albino, MD    Important Information About [place]

## 2022-06-09 ENCOUNTER — Telehealth: Payer: Self-pay | Admitting: Licensed Clinical Social Worker

## 2022-06-09 NOTE — Telephone Encounter (Signed)
H&V Care Navigation CSW Progress Note  Clinical Social Worker contacted patient by phone to f/u on assistance applications. No answer at [phone], left voicemail requesting return call as pt is able. I remain available, will re-attempt.   Patient is participating in a Managed Medicaid Plan:  No, self pay, working on assistance applications  [place]: No Food Insecurity (06/01/2022)  Housing: Low Risk  (06/01/2022)  Transportation Needs: No Transportation Needs (06/01/2022)  Utilities: Not At Risk (06/01/2022)  Financial Resource Strain: Low Risk  (06/01/2022)  Tobacco Use: Low Risk  (06/01/2022)   [name], MSW, [place]  [phone]- work cell phone (preferred) [phone]- desk phone

## 2022-06-09 NOTE — Telephone Encounter (Signed)
H&V Care Navigation CSW Progress Note  Clinical Social Worker  received return call from pt  to f/u on assistance applications. LCSW able to confirm pt has been able to get majority of documents. She will try and bring them to office tomorrow if not able to then she will give me a call and we will submit/assist alternately.   Patient is participating in a Managed Medicaid Plan:  No, self pay, working on [place]: No Food Insecurity (06/01/2022)  Housing: Low Risk  (06/01/2022)  Transportation Needs: No Transportation Needs (06/01/2022)  Utilities: Not At Risk (06/01/2022)  Financial Resource Strain: Low Risk  (06/01/2022)  Tobacco Use: Low Risk  (06/01/2022)   [name], MSW, [place]  [phone]- work cell phone (preferred) [phone]- desk phone

## 2022-06-10 ENCOUNTER — Ambulatory Visit (HOSPITAL_COMMUNITY)
Admission: RE | Admit: 2022-06-10 | Discharge: 2022-06-10 | Disposition: A | Discharge status: Home / Self Care | Payer: Self-pay | Source: Ambulatory Visit | Attending: Neurology | Admitting: Neurology

## 2022-06-10 DIAGNOSIS — R202 Paresthesia of skin: Secondary | ICD-10-CM | POA: Insufficient documentation

## 2022-06-10 DIAGNOSIS — R9389 Abnormal findings on diagnostic imaging of other specified body structures: Secondary | ICD-10-CM | POA: Insufficient documentation

## 2022-06-10 DIAGNOSIS — I6310 Cerebral infarction due to embolism of unspecified precerebral artery: Secondary | ICD-10-CM | POA: Insufficient documentation

## 2022-06-15 ENCOUNTER — Encounter: Payer: Self-pay | Admitting: Cardiovascular Disease

## 2022-06-15 ENCOUNTER — Telehealth: Payer: Self-pay | Admitting: Licensed Clinical Social Worker

## 2022-06-15 ENCOUNTER — Ambulatory Visit: Payer: Self-pay | Attending: Cardiovascular Disease | Admitting: Cardiovascular Disease

## 2022-06-15 VITALS — BP 122/88 | HR 67 | Ht 69.0 in | Wt 155.0 lb

## 2022-06-15 DIAGNOSIS — I639 Cerebral infarction, unspecified: Secondary | ICD-10-CM

## 2022-06-15 NOTE — Telephone Encounter (Signed)
H&V Care Navigation CSW Progress Note  Clinical Social Worker  received application  to submit for financial assistance. Application complete and supporting documents included. I have mailed this to [facility] Patient Accounting to review for financial assistance program. I have let pt know this was submitted. Will follow for any updates.   Patient is participating in a Managed Medicaid Plan:  No, self pay, working on financial assistance application.   SDOH Screenings   Food Insecurity: No Food Insecurity (06/01/2022)  Housing: Low Risk  (06/01/2022)  Transportation Needs: No Transportation Needs (06/01/2022)  Utilities: Not At Risk (06/01/2022)  Financial Resource Strain: Low Risk  (06/01/2022)  Tobacco Use: Low Risk  (06/15/2022)   [name], MSW, LCSW Clinical Social Worker II [place]  [phone]- work cell phone (preferred) [phone]- desk phone

## 2022-06-15 NOTE — Patient Instructions (Signed)
Medication Instructions:  Your physician recommends that you continue on your current medications as directed. Please refer to the Current Medication list given to you today.  Labwork: None ordered.  Testing/Procedures: None ordered.  Follow-Up:  Your physician wants you to follow-up in: one year with Dr. Mealor.  You will receive a reminder letter in the mail two months in advance. If you don't receive a letter, please call our office to schedule the follow-up appointment.    Implantable Loop Recorder Placement, Care After This sheet gives you information about how to care for yourself after your procedure. Your health care provider may also give you more specific instructions. If you have problems or questions, contact your health care provider. What can I expect after the procedure? After the procedure, it is common to have: Soreness or discomfort near the incision. Some swelling or bruising near the incision.  Follow these instructions at home: Incision care  Monitor your cardiac device site for redness, swelling, and drainage. Call the device clinic at [phone] if you experience these symptoms or fever/chills.  Keep the large square bandage on your site for 24 hours and then you may remove it yourself. Keep the steri-strips underneath in place.   You may shower after 72 hours / 3 days from your procedure with the steri-strips in place. They will usually fall off on their own, or may be removed after 10 days. Pat dry.   Avoid lotions, ointments, or perfumes over your incision until it is well-healed.  Please do not submerge in water until your site is completely healed.   Your device is MRI compatible.   Remote monitoring is used to monitor your cardiac device from home. This monitoring is scheduled every month by our office. It allows us to keep an eye on the function of your device to ensure it is working properly.  If your wound site starts to bleed apply pressure.     For help with the monitor please call [employer] Monitor Support Specialist directly at [phone].    If you have any questions/concerns please call the device clinic at [phone].  Activity  Return to your normal activities.  General instructions Follow instructions from your health care provider about how to manage your implantable loop recorder and transmit the information. Learn how to activate a recording if this is necessary for your type of device. You may go through a metal detection gate, and you may let someone hold a metal detector over your chest. Show your ID card if needed. Do not have an MRI unless you check with your health care provider first. Take over-the-counter and prescription medicines only as told by your health care provider. Keep all follow-up visits as told by your health care provider. This is important. Contact a health care provider if: You have redness, swelling, or pain around your incision. You have a fever. You have pain that is not relieved by your pain medicine. You have triggered your device because of fainting (syncope) or because of a heartbeat that feels like it is racing, slow, fluttering, or skipping (palpitations). Get help right away if you have: Chest pain. Difficulty breathing. Summary After the procedure, it is common to have soreness or discomfort near the incision. Change your dressing as told by your health care provider. Follow instructions from your health care provider about how to manage your implantable loop recorder and transmit the information. Keep all follow-up visits as told by your health care provider. This is important. This information   is not intended to replace advice given to you by your health care provider. Make sure you discuss any questions you have with your health care provider. Document Released: 07/15/2015 Document Revised: 09/18/2017 Document Reviewed: 09/18/2017 Elsevier Patient Education  2020 Elsevier  Inc.  

## 2022-06-15 NOTE — Progress Notes (Signed)
Cardiology Office Note:    Date:  06/15/2022   ID:  Shannon Murphy, DOB 1980/09/18, MRN [id number]  PCP:  Lorene Dy, [place] Providers Cardiologist:  None Electrophysiologist:  Melida Quitter, MD     Referring MD: Lorene Dy, MD   Chief complaint: left arm weakness and tingling  History of Present Illness:    Shannon Murphy is a 41 y.o. female with a hx of stroke referred to discuss ILR placement.  She presented to her ophthalmologist for evaluation of blurry vision.  She eventually underwent an MRI of the brain that showed possible embolic stroke involving the left occipital lobe and right frontal lobe. Echocardiogram and vascular ultrasound did not show obvious etiology, so the patient was referred for consideration of an implantable loop recorder.  Today, she reports that she is feeling fine, and she has no complaints.  She has not had any significant changes in diagnoses or medications since our recent visit.   Past Medical History:  Diagnosis Date   Abnormal MRI    Blurry vision    Cervical radiculopathy    Left hand weakness    Migraine    Neck pain    Numbness of left hand    Tunnel vision     History reviewed. No pertinent surgical history.  Current Medications: Current Meds  Medication Sig   amphetamine-dextroamphetamine (ADDERALL) 10 MG tablet Take 1 tablet by mouth daily.   ASPIRIN 81 PO Take by mouth. Daily   Ferrous Sulfate (SLOW FE PO) Take by mouth. 1 daily   HYDROcodone-acetaminophen (NORCO) 7.5-325 MG tablet Take 1 tablet by mouth 3 (three) times daily.   methocarbamol (ROBAXIN) 750 MG tablet Take 750 mg by mouth 3 (three) times daily.     Allergies:   Patient has no known allergies.   Social History   Socioeconomic History   Marital status: Single    Spouse name: Not on file   Number of children: Not on file   Years of education: Not on file   Highest education level: Not on file  Occupational History   Not  on file  Tobacco Use   Smoking status: Never   Smokeless tobacco: Never  Substance and Sexual Activity   Alcohol use: Yes    Comment: monthly   Drug use: Not on file   Sexual activity: Not on file  Other Topics Concern   Not on file  Social History Narrative   Not on file   Social Determinants of Health   Financial Resource Strain: Low Risk  (06/01/2022)   Overall Financial Resource Strain (CARDIA)    Difficulty of Paying Living Expenses: Not very hard  Food Insecurity: No Food Insecurity (06/01/2022)   Hunger Vital Sign    Worried About Running Out of Food in the Last Year: Never true    Ran Out of Food in the Last Year: Never true  Transportation Needs: No Transportation Needs (06/01/2022)   PRAPARE - Hydrologist (Medical): No    Lack of Transportation (Non-Medical): No  Physical Activity: Not on file  Stress: Not on file  Social Connections: Not on file     Family History: The patient's family history includes Stroke in her paternal aunt.  ROS:   Please see the history of present illness.    All other systems reviewed and are negative.  EKGs/Labs/Other Studies Reviewed:     TTE 05/19/2022 1. Left ventricular ejection fraction, by  estimation, is 60 to 65%. The  left ventricle has normal function. The left ventricle has no regional  wall motion abnormalities. There is mild left ventricular hypertrophy.  Left ventricular diastolic parameters  were normal.   2. Right ventricular systolic function is normal. The right ventricular  size is normal. There is normal pulmonary artery systolic pressure. The  estimated right ventricular systolic pressure is 21.0 mmHg.   3. The mitral valve is normal in structure. Trivial mitral valve  regurgitation.   4. The aortic valve is tricuspid. Aortic valve regurgitation is not  visualized. No aortic stenosis is present.   5. The inferior vena cava is normal in size with greater than 50%  respiratory  variability, suggesting right atrial pressure of 3 mmHg.   EKG:  Last EKG results: Today - NSR 75 bpm   Recent Labs: 05/06/2022: ALT 16; BUN 10; Creatinine, Ser 0.82; Hemoglobin 10.4; Platelets 282; Potassium 4.7; Sodium 138; TSH 3.250      Physical Exam:    VS:  BP 122/88   Pulse 67   Ht 5\' 9"  (1.753 m)   Wt 155 lb (70.3 kg)   LMP 05/11/2022 (Approximate)   SpO2 97%   BMI 22.89 kg/m     Wt Readings from Last 3 Encounters:  06/15/22 155 lb (70.3 kg)  06/01/22 155 lb 12.8 oz (70.7 kg)  05/06/22 155 lb (70.3 kg)     GEN: Well nourished, well developed in no acute distress CARDIAC: RRR, no murmurs, rubs, gallops RESPIRATORY:  Normal work of breathing MUSCULOSKELETAL: no edema    ASSESSMENT & PLAN:    Stroke: multiple territories, concerning for a cardioembolic source. I discussed the indication for continuous monitoring with a loop recorder.  I described the logistics of the procedure and the risks, including infection and minor bleeding.  She would like to proceed. I advised her to contact her neurologist or PCP about recurrent episodes of weakness and paresthesias.         PREPROCEDURE DIAGNOSIS:  Cryptogenic stroke    POSTPROCEDURE DIAGNOSIS:  Cryptogenic stroke     PROCEDURES:   1. Implantable loop recorder implantation       DESCRIPTION OF PROCEDURE:  Informed written consent was obtained.  The patient required no sedation for the procedure today.  Mapping over the patient's chest was performed to identify the area where electrograms were most prominent for ILR recording.  This area was found to be the left parasternal region over the 4th intercostal space. The patients left chest was therefore prepped and draped in the usual sterile fashion. The skin overlying the left parasternal region was infiltrated with lidocaine for local analgesia.  A 0.5-cm incision was made over the left parasternal region over the 3rd intercostal space.  Ana Abbott implantable loop  recorder was then placed into the pocket  R waves were very prominent and measured >0.3mV.  Steri- Strips and a sterile dressing were then applied.  There were no early apparent complications.     CONCLUSIONS:   1. Successful implantation of an Abbott implantable loop recorder for a history of cryptogenic stroke  2. No early apparent complications.          Medication Adjustments/Labs and Tests Ordered: Current medicines are reviewed at length with the patient today.  Concerns regarding medicines are outlined above.  No orders of the defined types were placed in this encounter.  No orders of the defined types were placed in this encounter.    Signed, 3m  Macee Venables, MD  06/15/2022 12:03 PM    [facility]

## 2022-07-13 ENCOUNTER — Telehealth: Payer: Self-pay | Admitting: Licensed Clinical Social Worker

## 2022-07-13 NOTE — Telephone Encounter (Signed)
H&V Care Navigation CSW Progress Note  Clinical Social Worker contacted patient by phone to f/u on denial noted for [employer]. Pt is over the Federal Poverty Limit per notes and per patient access representative Mellody Dance, "She is divorced, and her son turned 87 in 2021, and her daughter turned 33 in June of this year, so they were both 18 prior to the date of her application, and we could only show 1 in the family (herself). If a child turns 18 prior to the application date, even if the parent claims them on their taxes, 88 year olds are considered legal adults, and for our purposes, can't be used as family members."  I was not able to reach pt at [phone], I left voicemail, will re-attempt as able.     Patient is participating in a Managed Medicaid Plan:  No, self pay only.   SDOH Screenings   Food Insecurity: No Food Insecurity (06/01/2022)  Housing: Low Risk  (06/01/2022)  Transportation Needs: No Transportation Needs (06/01/2022)  Utilities: Not At Risk (06/01/2022)  Financial Resource Strain: Low Risk  (06/01/2022)  Tobacco Use: Low Risk  (06/15/2022)    Octavio Graves, MSW, LCSW Clinical Social Worker II [facility] Heart/Vascular Care Navigation  [phone]- work cell phone (preferred) [phone]- desk phone

## 2022-07-16 ENCOUNTER — Telehealth: Payer: Self-pay | Admitting: Licensed Clinical Social Worker

## 2022-07-16 NOTE — Telephone Encounter (Signed)
H&V Care Navigation CSW Progress Note  Clinical Social Worker contacted patient by phone to f/u on denial noted for [employer]. Pt is over the Federal Poverty Limit per notes and per patient access representative Mellody Dance. I was not able to reach pt again at [phone], I sent text message letting pt know the phone is sending me straight to voicemail and asking for a call back.  Pt returned my call, I discussed the above and she is not surprised by determination. We discussed calling billing and setting up a payment plan as well as speaking with a navigator about enrollment in commercial plan moving forward from the marketplace.   Pt agreeable to me emailing her those resources to [email or web address], I have done so- I remain available.    Patient is participating in a Managed Medicaid Plan:  No, self pay only.   SDOH Screenings   Food Insecurity: No Food Insecurity (06/01/2022)  Housing: Low Risk  (06/01/2022)  Transportation Needs: No Transportation Needs (06/01/2022)  Utilities: Not At Risk (06/01/2022)  Financial Resource Strain: Low Risk  (06/01/2022)  Tobacco Use: Low Risk  (06/15/2022)    Octavio Graves, MSW, LCSW Clinical Social Worker II [facility] Heart/Vascular Care Navigation  [phone]- work cell phone (preferred) [phone]- desk phone

## 2022-07-17 ENCOUNTER — Ambulatory Visit: Payer: Self-pay

## 2022-08-18 ENCOUNTER — Ambulatory Visit (INDEPENDENT_AMBULATORY_CARE_PROVIDER_SITE_OTHER): Payer: Self-pay

## 2022-08-18 DIAGNOSIS — I639 Cerebral infarction, unspecified: Secondary | ICD-10-CM

## 2022-08-18 LAB — CUP PACEART REMOTE DEVICE CHECK
Date Time Interrogation Session: 20240101020157
Implantable Pulse Generator Implant Date: 20231030
Pulse Gen Serial Number: 511012389

## 2022-09-17 LAB — CUP PACEART REMOTE DEVICE CHECK
Date Time Interrogation Session: 20240201033335
Implantable Pulse Generator Implant Date: 20231030
Pulse Gen Serial Number: 511012389

## 2022-09-18 ENCOUNTER — Ambulatory Visit: Payer: Self-pay

## 2022-09-18 DIAGNOSIS — I639 Cerebral infarction, unspecified: Secondary | ICD-10-CM

## 2022-09-24 NOTE — Progress Notes (Signed)
Merlin Loop Recorder  

## 2022-10-06 NOTE — Progress Notes (Signed)
Carelink Summary Report / Loop Recorder 

## 2022-10-19 ENCOUNTER — Ambulatory Visit (INDEPENDENT_AMBULATORY_CARE_PROVIDER_SITE_OTHER): Payer: Self-pay

## 2022-10-19 DIAGNOSIS — I639 Cerebral infarction, unspecified: Secondary | ICD-10-CM

## 2022-10-20 LAB — CUP PACEART REMOTE DEVICE CHECK
Date Time Interrogation Session: 20240305081608
Implantable Pulse Generator Implant Date: 20231030
Pulse Gen Serial Number: 511012389

## 2022-11-19 ENCOUNTER — Ambulatory Visit (INDEPENDENT_AMBULATORY_CARE_PROVIDER_SITE_OTHER): Payer: Self-pay

## 2022-11-19 DIAGNOSIS — I639 Cerebral infarction, unspecified: Secondary | ICD-10-CM

## 2022-11-23 LAB — CUP PACEART REMOTE DEVICE CHECK
Date Time Interrogation Session: 20240406080047
Implantable Pulse Generator Implant Date: 20231030
Pulse Gen Serial Number: 511012389

## 2022-11-30 NOTE — Progress Notes (Signed)
Merlin Loop Recorder 

## 2022-12-21 ENCOUNTER — Ambulatory Visit (INDEPENDENT_AMBULATORY_CARE_PROVIDER_SITE_OTHER): Payer: Self-pay

## 2022-12-21 DIAGNOSIS — I639 Cerebral infarction, unspecified: Secondary | ICD-10-CM

## 2022-12-21 LAB — CUP PACEART REMOTE DEVICE CHECK
Date Time Interrogation Session: 20240506024151
Implantable Pulse Generator Implant Date: 20231030
Pulse Gen Serial Number: 511012389

## 2022-12-23 NOTE — Progress Notes (Signed)
Carelink Summary Report / Loop Recorder 

## 2023-01-20 NOTE — Progress Notes (Signed)
Merlin Loop Recorder 

## 2023-01-21 ENCOUNTER — Ambulatory Visit (INDEPENDENT_AMBULATORY_CARE_PROVIDER_SITE_OTHER): Payer: Self-pay

## 2023-01-21 DIAGNOSIS — I639 Cerebral infarction, unspecified: Secondary | ICD-10-CM

## 2023-01-21 LAB — CUP PACEART REMOTE DEVICE CHECK
Date Time Interrogation Session: 20240606021024
Implantable Pulse Generator Implant Date: 20231030
Pulse Gen Serial Number: 511012389

## 2023-02-12 NOTE — Progress Notes (Signed)
Carelink Summary Report / Loop Recorder 

## 2023-02-22 ENCOUNTER — Ambulatory Visit (INDEPENDENT_AMBULATORY_CARE_PROVIDER_SITE_OTHER): Payer: Self-pay

## 2023-02-22 DIAGNOSIS — I639 Cerebral infarction, unspecified: Secondary | ICD-10-CM

## 2023-02-22 LAB — CUP PACEART REMOTE DEVICE CHECK
Date Time Interrogation Session: 20240707020040
Implantable Pulse Generator Implant Date: 20231030
Pulse Gen Serial Number: 511012389

## 2023-03-11 NOTE — Progress Notes (Signed)
Merlin Loop Recorder  

## 2023-03-25 ENCOUNTER — Ambulatory Visit (INDEPENDENT_AMBULATORY_CARE_PROVIDER_SITE_OTHER): Payer: Self-pay

## 2023-03-25 DIAGNOSIS — I639 Cerebral infarction, unspecified: Secondary | ICD-10-CM

## 2023-04-26 ENCOUNTER — Ambulatory Visit (INDEPENDENT_AMBULATORY_CARE_PROVIDER_SITE_OTHER): Payer: Self-pay

## 2023-04-26 DIAGNOSIS — I639 Cerebral infarction, unspecified: Secondary | ICD-10-CM

## 2023-04-26 LAB — CUP PACEART REMOTE DEVICE CHECK
Date Time Interrogation Session: 20240907091714
Implantable Pulse Generator Implant Date: 20231030
Pulse Gen Serial Number: 511012389

## 2023-05-11 NOTE — Progress Notes (Signed)
MERLIN LOOP RECORDER

## 2023-05-13 NOTE — Progress Notes (Signed)
Merlin Loop Recorder  

## 2023-05-31 ENCOUNTER — Ambulatory Visit (INDEPENDENT_AMBULATORY_CARE_PROVIDER_SITE_OTHER): Payer: Self-pay

## 2023-05-31 DIAGNOSIS — I639 Cerebral infarction, unspecified: Secondary | ICD-10-CM

## 2023-06-01 LAB — CUP PACEART REMOTE DEVICE CHECK
Date Time Interrogation Session: 20241015140429
Implantable Pulse Generator Implant Date: 20231030
Pulse Gen Serial Number: 511012389

## 2023-06-17 NOTE — Progress Notes (Signed)
Merlin Loop Recorder  

## 2023-07-05 ENCOUNTER — Ambulatory Visit (INDEPENDENT_AMBULATORY_CARE_PROVIDER_SITE_OTHER): Payer: Self-pay

## 2023-07-05 DIAGNOSIS — I639 Cerebral infarction, unspecified: Secondary | ICD-10-CM

## 2023-07-05 LAB — CUP PACEART REMOTE DEVICE CHECK
Date Time Interrogation Session: 20241118020156
Implantable Pulse Generator Implant Date: 20231030
Pulse Gen Serial Number: 511012389

## 2023-07-30 NOTE — Progress Notes (Signed)
Merlin Loop Recorder  

## 2023-08-09 ENCOUNTER — Ambulatory Visit (INDEPENDENT_AMBULATORY_CARE_PROVIDER_SITE_OTHER): Payer: Self-pay

## 2023-08-09 DIAGNOSIS — I639 Cerebral infarction, unspecified: Secondary | ICD-10-CM

## 2023-08-10 LAB — CUP PACEART REMOTE DEVICE CHECK
Date Time Interrogation Session: 20241223232820
Implantable Pulse Generator Implant Date: 20231030
Pulse Gen Serial Number: 511012389

## 2023-09-13 ENCOUNTER — Ambulatory Visit (INDEPENDENT_AMBULATORY_CARE_PROVIDER_SITE_OTHER): Payer: Self-pay

## 2023-09-13 DIAGNOSIS — I639 Cerebral infarction, unspecified: Secondary | ICD-10-CM

## 2023-09-14 LAB — CUP PACEART REMOTE DEVICE CHECK
Date Time Interrogation Session: 20250128081048
Implantable Pulse Generator Implant Date: 20231030
Pulse Gen Serial Number: 511012389

## 2023-09-17 ENCOUNTER — Encounter: Payer: Self-pay | Admitting: Cardiovascular Disease

## 2023-09-20 NOTE — Addendum Note (Signed)
Addended by: Geralyn Flash D on: 09/20/2023 11:37 AM   Modules accepted: Orders

## 2023-09-20 NOTE — Progress Notes (Signed)
 Merlin Loop [employer]

## 2023-10-18 ENCOUNTER — Ambulatory Visit (INDEPENDENT_AMBULATORY_CARE_PROVIDER_SITE_OTHER): Payer: Self-pay

## 2023-10-18 DIAGNOSIS — I639 Cerebral infarction, unspecified: Secondary | ICD-10-CM

## 2023-10-19 LAB — CUP PACEART REMOTE DEVICE CHECK
Date Time Interrogation Session: 20250303080152
Implantable Pulse Generator Implant Date: 20231030
Pulse Gen Model: 5000
Pulse Gen Serial Number: 511012389

## 2023-10-20 ENCOUNTER — Encounter: Payer: Self-pay | Admitting: Cardiovascular Disease

## 2023-10-25 NOTE — Addendum Note (Signed)
 Addended by: Geralyn Flash D on: 10/25/2023 12:20 PM   Modules accepted: Orders

## 2023-10-25 NOTE — Progress Notes (Signed)
 Merlin Loop [employer]

## 2023-11-22 NOTE — Progress Notes (Signed)
 Merlin Loop [employer]

## 2023-11-22 NOTE — Addendum Note (Signed)
 Addended by: Geralyn Flash D on: 11/22/2023 12:59 PM   Modules accepted: Orders

## 2024-04-05 ENCOUNTER — Encounter: Payer: Self-pay | Admitting: Emergency Medicine
